# Patient Record
Sex: Female | Born: 1970
Health system: Southern US, Community
[De-identification: ages and names within clinical notes are randomized; demographics above are authoritative.]

## PROBLEM LIST (undated history)

## (undated) DIAGNOSIS — T7840XA Allergy, unspecified, initial encounter: Secondary | ICD-10-CM

## (undated) HISTORY — DX: Allergy, unspecified, initial encounter: T78.40XA

## (undated) HISTORY — PX: AUGMENTATION MAMMAPLASTY: SUR837

---

## 2009-10-30 ENCOUNTER — Emergency Department (HOSPITAL_COMMUNITY): Admission: EM | Admit: 2009-10-30 | Discharge: 2009-10-30 | Payer: Self-pay | Admitting: Emergency Medicine

## 2010-04-29 ENCOUNTER — Other Ambulatory Visit: Payer: Self-pay | Admitting: Obstetrics and Gynecology

## 2010-04-29 DIAGNOSIS — Z1239 Encounter for other screening for malignant neoplasm of breast: Secondary | ICD-10-CM

## 2010-04-29 DIAGNOSIS — Z1231 Encounter for screening mammogram for malignant neoplasm of breast: Secondary | ICD-10-CM

## 2010-05-10 ENCOUNTER — Ambulatory Visit
Admission: RE | Admit: 2010-05-10 | Discharge: 2010-05-10 | Disposition: A | Discharge status: Home / Self Care | Payer: Commercial Managed Care - PPO | Source: Ambulatory Visit | Attending: Obstetrics and Gynecology | Admitting: Obstetrics and Gynecology

## 2010-05-10 DIAGNOSIS — Z1231 Encounter for screening mammogram for malignant neoplasm of breast: Secondary | ICD-10-CM

## 2014-06-27 ENCOUNTER — Ambulatory Visit (INDEPENDENT_AMBULATORY_CARE_PROVIDER_SITE_OTHER): Payer: PRIVATE HEALTH INSURANCE | Admitting: Physician Assistant

## 2014-06-27 VITALS — BP 126/80 | HR 79 | Temp 98.0°F | Resp 16 | Ht 65.5 in | Wt 116.0 lb

## 2014-06-27 DIAGNOSIS — I872 Venous insufficiency (chronic) (peripheral): Secondary | ICD-10-CM | POA: Diagnosis not present

## 2014-06-27 NOTE — Progress Notes (Signed)
[facility] [address], [place] [address] [phone]  Date:  06/27/2014   Name:  Brandi Gray   DOB:  November 26, 1970   MRN:  [id number]  PCP:  Roselee Culver, MD    Chief Complaint: Follow-up   History of Present Illness:  Brandi Gray is a 44 y.o. very pleasant female otherwise healthy patient who presents with the following:  Patient is here for referral to vein specialist.  She reports 5 years of noticeable spider veins along legs.  This first appeared along the right leg, and has now become apparent on left leg.  She denies pain.  General swelling along lower extremity is generally after a work day.  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  She has been seeing Dr. Renaldo Reel at [facility] to address veiny appearance.  Patient reports findings of saphenous vein reflux.  She denies any loss of sensation at legs.  She denies easy bruising, chest pains, palpitations, or dizziness.   Now that the findings suggest medical attention, patient was told by Dr. Renaldo Reel office, that she must get a referral to have "medical" visit, unlike prior visits which were cosmetic.     There are no active problems to display for this patient.   Past Medical History  Diagnosis Date  . Allergy     History reviewed. No pertinent past surgical history.  History  Substance Use Topics  . Smoking status: Never Smoker   . Smokeless tobacco: Never Used  . Alcohol Use: 0.0 oz/week    0 Standard drinks or equivalent per week     Comment: social use    Family History  Problem Relation Age of Onset  . Hypertension Mother   . Cancer Father     Allergies  Allergen Reactions  . Sulfa Antibiotics     Headache     Medication list has been reviewed and updated.  No current outpatient prescriptions on file prior to visit.   No current facility-administered medications on file prior to visit.    Review of Systems: ROS otherwise unremarkable unless  listed below.  Physical Examination: Filed Vitals:   06/27/14 1543  BP: 126/80  Pulse: 79  Temp: 98 F (36.7 C)  Resp: 16   Filed Vitals:   06/27/14 1543  Height: 5' 5.5" (1.664 m)  Weight: 116 lb (52.617 kg)   Body mass index is 19 kg/(m^2). Ideal Body Weight: Weight in (lb) to have BMI = 25: 152.2  Physical Exam  Constitutional: She is oriented to person, place, and time. She appears well-developed and well-nourished. No distress.  HENT:  Head: Normocephalic and atraumatic.  Eyes: Pupils are equal, round, and reactive to light.  Neck: Normal range of motion. No thyromegaly present.  Cardiovascular: Normal rate, regular rhythm, normal heart sounds and intact distal pulses.  Exam reveals no gallop, no distant heart sounds and no friction rub.   No murmur heard. Pulses:      Popliteal pulses are 1+ on the right side, and 2+ on the left side.       Dorsalis pedis pulses are 2+ on the right side, and 2+ on the left side.       Posterior tibial pulses are 2+ on the right side, and 2+ on the left side.  Pulmonary/Chest: Effort normal and breath sounds normal. No respiratory distress. She has no wheezes.  Musculoskeletal: Normal range of motion. She exhibits no edema or tenderness.  Lymphadenopathy:  She has no cervical adenopathy.  Neurological: She is alert and oriented to person, place, and time.  Skin: No rash noted. She is not diaphoretic.  No erythema or swelling at extremity.  Spider veins scantly placed bilaterally along the calf.  No prominent varicose apparent.    Psychiatric: She has a normal mood and affect. Her behavior is normal.     Assessment and Plan: 44 year old female is here today for desire for referral for vein specialist.  Venous insufficiency - Plan: Ambulatory referral to Vascular Surgery Ivar Drape, PA-C [facility] 3/29/201610:36 PM

## 2015-07-23 DIAGNOSIS — Z0000 Encounter for general adult medical examination without abnormal findings: Secondary | ICD-10-CM | POA: Diagnosis not present

## 2015-07-23 DIAGNOSIS — Z13220 Encounter for screening for lipoid disorders: Secondary | ICD-10-CM | POA: Diagnosis not present

## 2015-09-25 ENCOUNTER — Other Ambulatory Visit (HOSPITAL_COMMUNITY)
Admission: RE | Admit: 2015-09-25 | Discharge: 2015-09-25 | Disposition: A | Discharge status: Home / Self Care | Payer: 59 | Source: Ambulatory Visit | Attending: Family Medicine | Admitting: Family Medicine

## 2015-09-25 ENCOUNTER — Other Ambulatory Visit: Payer: Self-pay | Admitting: Family Medicine

## 2015-09-25 DIAGNOSIS — Z01419 Encounter for gynecological examination (general) (routine) without abnormal findings: Secondary | ICD-10-CM | POA: Diagnosis not present

## 2015-09-26 LAB — CYTOLOGY - PAP

## 2016-05-05 DIAGNOSIS — H524 Presbyopia: Secondary | ICD-10-CM | POA: Diagnosis not present

## 2016-08-05 DIAGNOSIS — I83813 Varicose veins of bilateral lower extremities with pain: Secondary | ICD-10-CM | POA: Diagnosis not present

## 2016-08-08 DIAGNOSIS — I8311 Varicose veins of right lower extremity with inflammation: Secondary | ICD-10-CM | POA: Diagnosis not present

## 2016-08-08 DIAGNOSIS — I8312 Varicose veins of left lower extremity with inflammation: Secondary | ICD-10-CM | POA: Diagnosis not present

## 2016-08-26 DIAGNOSIS — I83813 Varicose veins of bilateral lower extremities with pain: Secondary | ICD-10-CM | POA: Diagnosis not present

## 2017-04-20 DIAGNOSIS — H524 Presbyopia: Secondary | ICD-10-CM | POA: Diagnosis not present

## 2017-07-16 DIAGNOSIS — Z0000 Encounter for general adult medical examination without abnormal findings: Secondary | ICD-10-CM | POA: Diagnosis not present

## 2017-07-16 DIAGNOSIS — Z13220 Encounter for screening for lipoid disorders: Secondary | ICD-10-CM | POA: Diagnosis not present

## 2017-08-18 DIAGNOSIS — I83813 Varicose veins of bilateral lower extremities with pain: Secondary | ICD-10-CM | POA: Diagnosis not present

## 2018-01-28 ENCOUNTER — Other Ambulatory Visit: Payer: Self-pay | Admitting: Family Medicine

## 2018-01-28 DIAGNOSIS — Z1231 Encounter for screening mammogram for malignant neoplasm of breast: Secondary | ICD-10-CM

## 2018-03-09 ENCOUNTER — Ambulatory Visit
Admission: RE | Admit: 2018-03-09 | Discharge: 2018-03-09 | Disposition: A | Discharge status: Home / Self Care | Payer: 59 | Source: Ambulatory Visit | Attending: Family Medicine | Admitting: Family Medicine

## 2018-03-09 DIAGNOSIS — Z1231 Encounter for screening mammogram for malignant neoplasm of breast: Secondary | ICD-10-CM | POA: Diagnosis not present

## 2018-08-31 ENCOUNTER — Other Ambulatory Visit (HOSPITAL_COMMUNITY)
Admission: RE | Admit: 2018-08-31 | Discharge: 2018-08-31 | Disposition: A | Discharge status: Home / Self Care | Payer: 59 | Source: Ambulatory Visit | Attending: Family Medicine | Admitting: Family Medicine

## 2018-08-31 ENCOUNTER — Other Ambulatory Visit: Payer: Self-pay | Admitting: Family Medicine

## 2018-08-31 DIAGNOSIS — Z124 Encounter for screening for malignant neoplasm of cervix: Secondary | ICD-10-CM | POA: Diagnosis not present

## 2018-08-31 DIAGNOSIS — Z136 Encounter for screening for cardiovascular disorders: Secondary | ICD-10-CM | POA: Diagnosis not present

## 2018-08-31 DIAGNOSIS — Z0000 Encounter for general adult medical examination without abnormal findings: Secondary | ICD-10-CM | POA: Diagnosis not present

## 2018-09-02 LAB — CYTOLOGY - PAP
Diagnosis: NEGATIVE
HPV: NOT DETECTED

## 2019-03-02 MED FILL — CEPHALEXIN 500 MG CAPSULE: 500 | 1 days supply | Qty: 4 | Fill #0

## 2019-09-01 DIAGNOSIS — L709 Acne, unspecified: Secondary | ICD-10-CM | POA: Diagnosis not present

## 2019-09-01 DIAGNOSIS — Z1211 Encounter for screening for malignant neoplasm of colon: Secondary | ICD-10-CM | POA: Diagnosis not present

## 2019-09-01 DIAGNOSIS — Z13220 Encounter for screening for lipoid disorders: Secondary | ICD-10-CM | POA: Diagnosis not present

## 2019-09-01 DIAGNOSIS — Z0000 Encounter for general adult medical examination without abnormal findings: Secondary | ICD-10-CM | POA: Diagnosis not present

## 2019-09-06 ENCOUNTER — Encounter: Payer: Self-pay | Admitting: Gastroenterology

## 2019-09-12 DIAGNOSIS — H524 Presbyopia: Secondary | ICD-10-CM | POA: Diagnosis not present

## 2019-11-02 ENCOUNTER — Ambulatory Visit (AMBULATORY_SURGERY_CENTER): Payer: Self-pay

## 2019-11-02 ENCOUNTER — Other Ambulatory Visit: Payer: Self-pay

## 2019-11-02 ENCOUNTER — Encounter: Payer: Self-pay | Admitting: Gastroenterology

## 2019-11-02 VITALS — Ht 65.5 in | Wt 118.6 lb

## 2019-11-02 DIAGNOSIS — Z01818 Encounter for other preprocedural examination: Secondary | ICD-10-CM

## 2019-11-02 DIAGNOSIS — Z1211 Encounter for screening for malignant neoplasm of colon: Secondary | ICD-10-CM

## 2019-11-02 MED ORDER — NA SULFATE-K SULFATE-MG SULF 17.5-3.13-1.6 GM/177ML PO SOLN: 1.0000 | Freq: Once | ORAL | 0 refills | Status: AC

## 2019-11-02 MED FILL — SUPREP BOWEL PREP KIT: 17.5-3.13-1 | 1 days supply | Qty: 354 | Fill #0

## 2019-11-02 NOTE — Progress Notes (Signed)
No egg or soy allergy known to patient  No issues with past sedation with any surgeries or procedures No intubation problems in the past  No diet pills per patient No home 02 use per patient  No blood thinners per patient  Pt denies issues with constipation  No A fib or A flutter  EMMI video to pt   COVID 19 guidelines implemented in [facility] today  COVID vaccines completed on 04/2019 per pt;  Due to the COVID-19 pandemic we are asking patients to follow these guidelines. Please only bring one care partner. Please be aware that your care partner may wait in the car in the parking lot or if they feel like they will be too hot to wait in the car, they may wait in the lobby on the 4th floor. All care partners are required to wear a mask the entire time (we do not have any that we can provide them), they need to practice social distancing, and we will do a Covid check for all patient's and care partners when you arrive. Also we will check their temperature and your temperature. If the care partner waits in their car they need to stay in the parking lot the entire time and we will call them on their cell phone when the patient is ready for discharge so they can bring the car to the front of the building. Also all patient's will need to wear a mask into building.

## 2019-11-16 ENCOUNTER — Other Ambulatory Visit: Payer: Self-pay

## 2019-11-16 ENCOUNTER — Encounter: Payer: Self-pay | Admitting: Gastroenterology

## 2019-11-16 ENCOUNTER — Ambulatory Visit (AMBULATORY_SURGERY_CENTER): Payer: 59 | Admitting: Gastroenterology

## 2019-11-16 VITALS — BP 106/62 | HR 62 | Temp 97.8°F | Resp 17 | Ht 65.5 in | Wt 118.6 lb

## 2019-11-16 DIAGNOSIS — D128 Benign neoplasm of rectum: Secondary | ICD-10-CM

## 2019-11-16 DIAGNOSIS — Z1211 Encounter for screening for malignant neoplasm of colon: Secondary | ICD-10-CM | POA: Diagnosis not present

## 2019-11-16 DIAGNOSIS — D129 Benign neoplasm of anus and anal canal: Secondary | ICD-10-CM

## 2019-11-16 DIAGNOSIS — K635 Polyp of colon: Secondary | ICD-10-CM

## 2019-11-16 DIAGNOSIS — K621 Rectal polyp: Secondary | ICD-10-CM | POA: Diagnosis not present

## 2019-11-16 MED ORDER — SODIUM CHLORIDE 0.9 % IV SOLN: 500.0000 mL | Freq: Once | INTRAVENOUS | Status: DC

## 2019-11-16 NOTE — Patient Instructions (Signed)
Discharge instructions given. Handouts on polyps and hemorrhoids. Resume previous medications. YOU HAD AN ENDOSCOPIC PROCEDURE TODAY AT THE [facility]:   Refer to the procedure report that was given to you for any specific questions about what was found during the examination.  If the procedure report does not answer your questions, please call your gastroenterologist to clarify.  If you requested that your care partner not be given the details of your procedure findings, then the procedure report has been included in a sealed envelope for you to review at your convenience later.  YOU SHOULD EXPECT: Some feelings of bloating in the abdomen. Passage of more gas than usual.  Walking can help get rid of the air that was put into your GI tract during the procedure and reduce the bloating. If you had a lower endoscopy (such as a colonoscopy or flexible sigmoidoscopy) you may notice spotting of blood in your stool or on the toilet paper. If you underwent a bowel prep for your procedure, you may not have a normal bowel movement for a few days.  Please Note:  You might notice some irritation and congestion in your nose or some drainage.  This is from the oxygen used during your procedure.  There is no need for concern and it should clear up in a day or so.  SYMPTOMS TO REPORT IMMEDIATELY:  Following lower endoscopy (colonoscopy or flexible sigmoidoscopy):  Excessive amounts of blood in the stool  Significant tenderness or worsening of abdominal pains  Swelling of the abdomen that is new, acute  Fever of 100F or higher   For urgent or emergent issues, a gastroenterologist can be reached at any hour by calling [phone]. Do not use MyChart messaging for urgent concerns.    DIET:  We do recommend a small meal at first, but then you may proceed to your regular diet.  Drink plenty of fluids but you should avoid alcoholic beverages for 24 hours.  ACTIVITY:  You should plan to take it  easy for the rest of today and you should NOT DRIVE or use heavy machinery until tomorrow (because of the sedation medicines used during the test).    FOLLOW UP: Our staff will call the number listed on your records 48-72 hours following your procedure to check on you and address any questions or concerns that you may have regarding the information given to you following your procedure. If we do not reach you, we will leave a message.  We will attempt to reach you two times.  During this call, we will ask if you have developed any symptoms of COVID 19. If you develop any symptoms (ie: fever, flu-like symptoms, shortness of breath, cough etc.) before then, please call [phone].  If you test positive for Covid 19 in the 2 weeks post procedure, please call and report this information to us.    If any biopsies were taken you will be contacted by phone or by letter within the next 1-3 weeks.  Please call us at [phone] if you have not heard about the biopsies in 3 weeks.    SIGNATURES/CONFIDENTIALITY: You and/or your care partner have signed paperwork which will be entered into your electronic medical record.  These signatures attest to the fact that that the information above on your After Visit Summary has been reviewed and is understood.  Full responsibility of the confidentiality of this discharge information lies with you and/or your care-partner.  

## 2019-11-16 NOTE — Progress Notes (Signed)
Called to room to assist during endoscopic procedure.  Patient ID and intended procedure confirmed with present staff. Received instructions for my participation in the procedure from the performing physician.  

## 2019-11-16 NOTE — Op Note (Signed)
[facility] Patient Name: Brandi Gray Procedure Date: 11/16/2019 9:49 AM MRN: [id number] Endoscopist: Milus Banister , MD Age: 49 Referring MD:  Date of Birth: 26-Mar-1971 Gender: Female Account #: [id number] Procedure:                Colonoscopy Indications:              Screening for colorectal malignant neoplasm Medicines:                Monitored Anesthesia Care Procedure:                Pre-Anesthesia Assessment:                           - Prior to the procedure, a History and Physical                            was performed, and patient medications and                            allergies were reviewed. The patient's tolerance of                            previous anesthesia was also reviewed. The risks                            and benefits of the procedure and the sedation                            options and risks were discussed with the patient.                            All questions were answered, and informed consent                            was obtained. Prior Anticoagulants: The patient has                            taken no previous anticoagulant or antiplatelet                            agents. ASA Grade Assessment: I - A normal, healthy                            patient. After reviewing the risks and benefits,                            the patient was deemed in satisfactory condition to                            undergo the procedure.                           After obtaining informed consent, the colonoscope  was passed under direct vision. Throughout the                            procedure, the patient's blood pressure, pulse, and                            oxygen saturations were monitored continuously. The                            Colonoscope was introduced through the anus and                            advanced to the the cecum, identified by                            appendiceal orifice and ileocecal  valve. The                            colonoscopy was performed without difficulty. The                            patient tolerated the procedure well. The quality                            of the bowel preparation was good. The ileocecal                            valve, appendiceal orifice, and rectum were                            photographed. Scope In: 9:56:04 AM Scope Out: 10:08:31 AM Scope Withdrawal Time: 0 hours 8 minutes 19 seconds  Total Procedure Duration: 0 hours 12 minutes 27 seconds  Findings:                 A 2 mm polyp was found in the recto-sigmoid colon.                            The polyp was sessile. The polyp was removed with a                            cold snare. Resection and retrieval were complete.                           Internal hemorrhoids were found. The hemorrhoids                            were small.                           The exam was otherwise without abnormality on                            direct and retroflexion views. Complications:  No immediate complications. Estimated blood loss:                            None. Estimated Blood Loss:     Estimated blood loss: none. Impression:               - One 2 mm polyp at the recto-sigmoid colon,                            removed with a cold snare. Resected and retrieved.                           - Small internal hemorrhoids.                           - The examination was otherwise normal on direct                            and retroflexion views. Recommendation:           - Patient has a contact number available for                            emergencies. The signs and symptoms of potential                            delayed complications were discussed with the                            patient. Return to normal activities tomorrow.                            Written discharge instructions were provided to the                            patient.                           -  Resume previous diet.                           - Continue present medications.                           - Await pathology results. Milus Banister, MD 11/16/2019 10:12:32 AM This report has been signed electronically.

## 2019-11-16 NOTE — Progress Notes (Signed)
To PACU, VSS. Report to [email or web address] 

## 2019-11-16 NOTE — Progress Notes (Signed)
VS-CW  Pt's states no medical or surgical changes since previsit or office visit.  

## 2019-11-18 ENCOUNTER — Telehealth: Payer: Self-pay

## 2019-11-18 ENCOUNTER — Telehealth: Payer: Self-pay | Admitting: *Deleted

## 2019-11-18 NOTE — Telephone Encounter (Signed)
2nd follow up call made.  NALM 

## 2019-11-18 NOTE — Telephone Encounter (Signed)
FIRST FOLLOW UP CALL MADE, LEFT MESSAGE.

## 2019-11-21 ENCOUNTER — Encounter: Payer: Self-pay | Admitting: Gastroenterology

## 2020-07-27 ENCOUNTER — Other Ambulatory Visit: Payer: Self-pay | Admitting: Family Medicine

## 2020-07-27 DIAGNOSIS — Z1231 Encounter for screening mammogram for malignant neoplasm of breast: Secondary | ICD-10-CM

## 2020-09-18 ENCOUNTER — Ambulatory Visit
Admission: RE | Admit: 2020-09-18 | Discharge: 2020-09-18 | Disposition: A | Discharge status: Home / Self Care | Payer: 59 | Source: Ambulatory Visit | Attending: Family Medicine | Admitting: Family Medicine

## 2020-09-18 ENCOUNTER — Ambulatory Visit: Payer: 59

## 2020-09-18 ENCOUNTER — Other Ambulatory Visit: Payer: Self-pay

## 2020-09-18 DIAGNOSIS — Z1231 Encounter for screening mammogram for malignant neoplasm of breast: Secondary | ICD-10-CM

## 2022-01-30 IMAGING — MG DIGITAL SCREENING BREAST BILAT IMPLANT W/ TOMO W/ CAD
9 of 12 series · 9 of 28 positions shown · non-contrast
Comparison: Previous exam(s).

CLINICAL DATA: Screening.

EXAM:
DIGITAL SCREENING BILATERAL MAMMOGRAM WITH IMPLANTS, CAD AND
TOMOSYNTHESIS
TECHNIQUE: Bilateral screening digital craniocaudal and mediolateral oblique
mammograms were obtained. Bilateral screening digital breast
tomosynthesis was performed. The images were evaluated with
computer-aided detection. Standard and/or implant displaced views
were performed.

[R MLO]
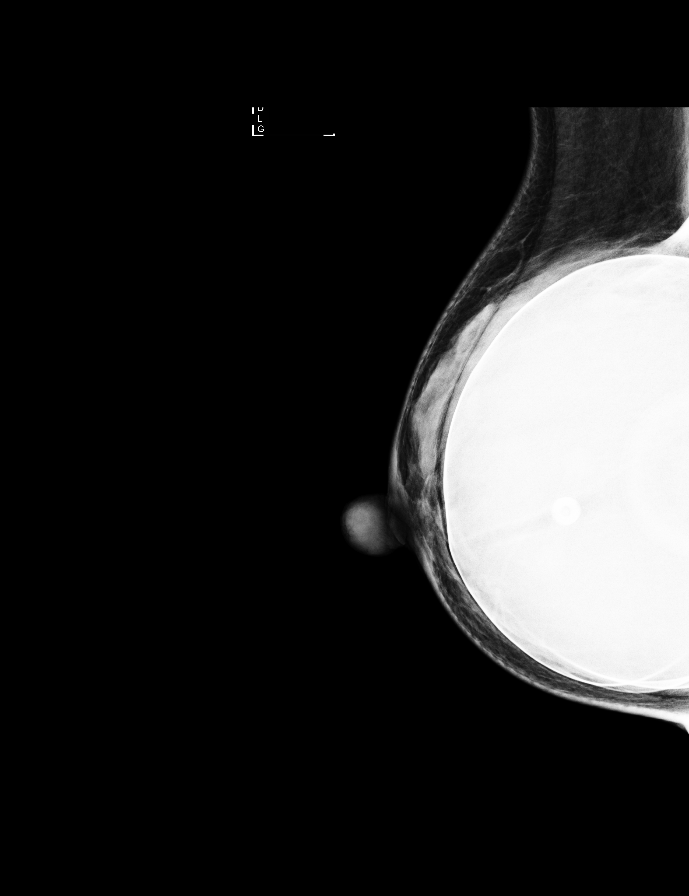

[L MLO]
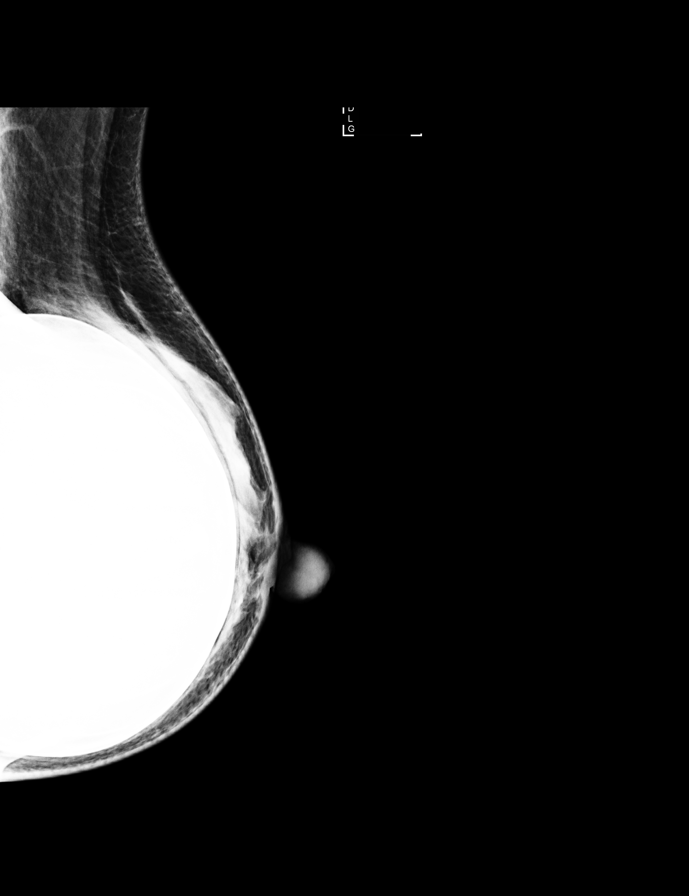

[R CC]
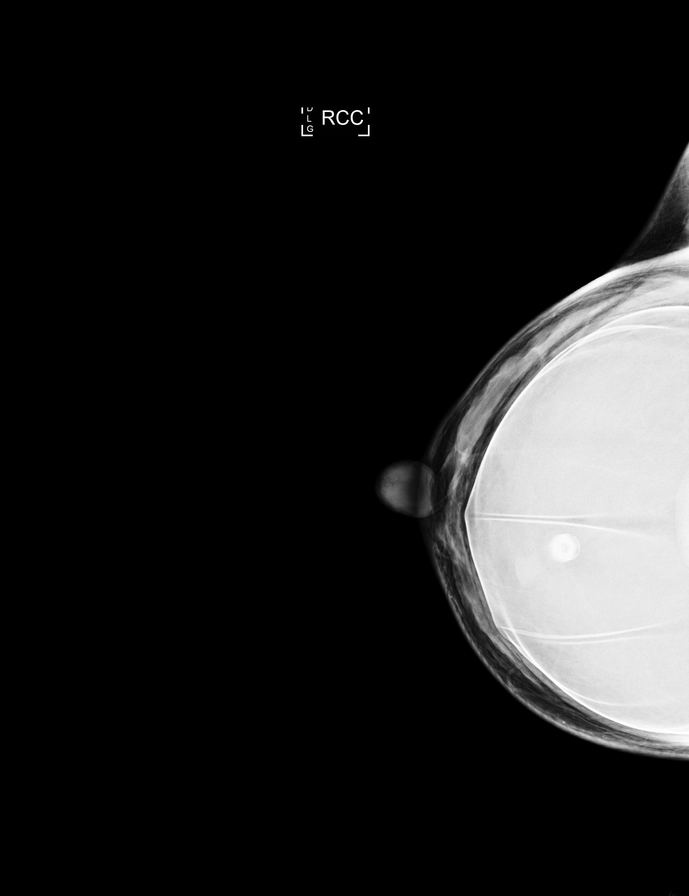

[L CC]
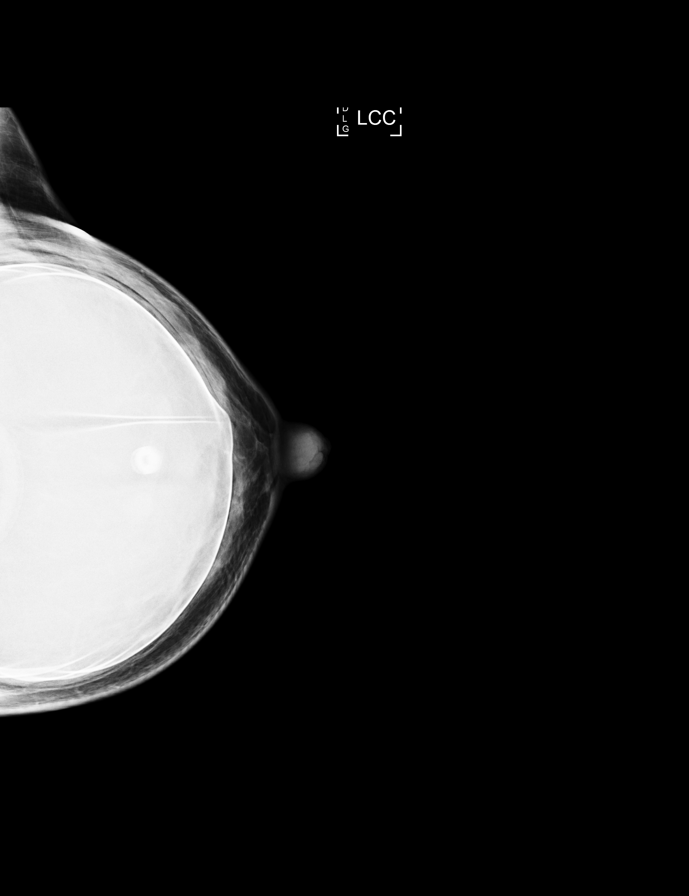

[L MLO synth-2D]
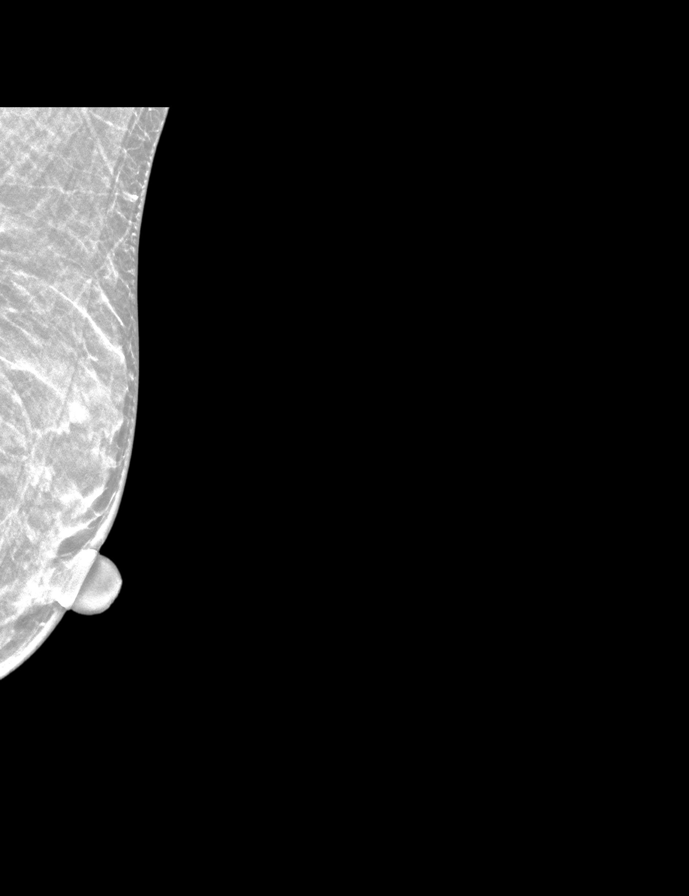

[R MLO synth-2D]
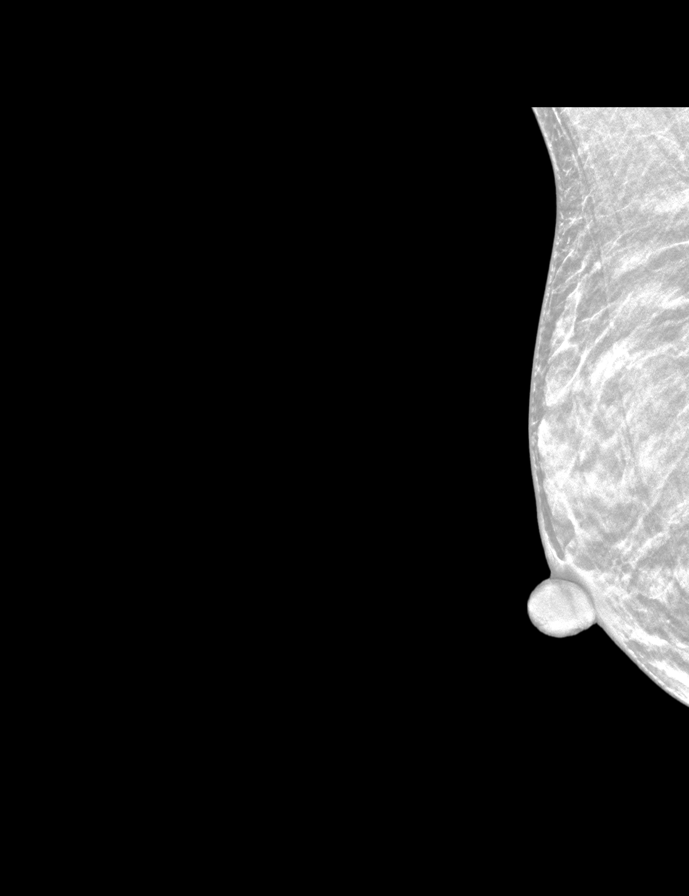

[R CC synth-2D]
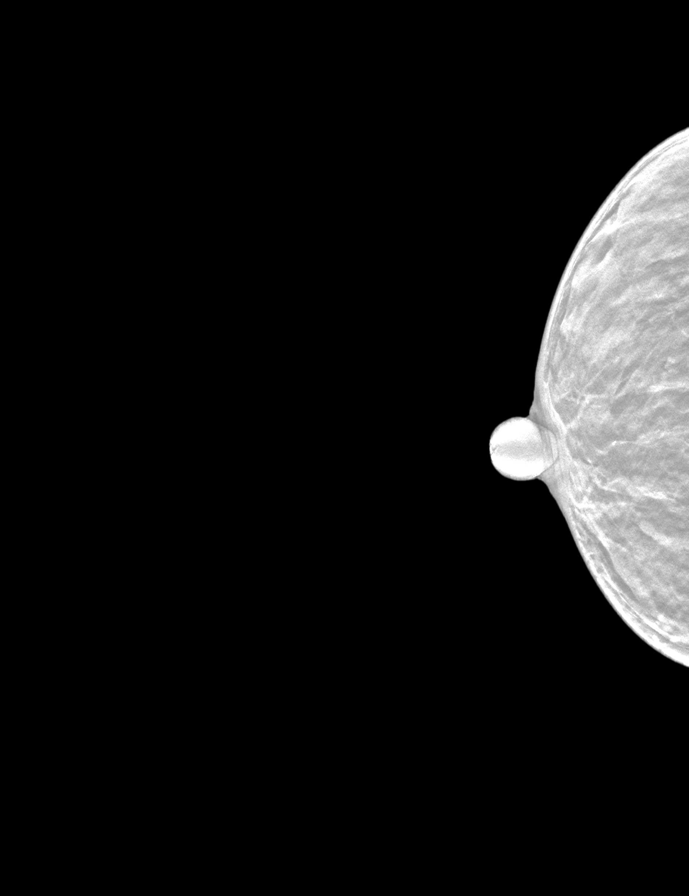

[L CC synth-2D]
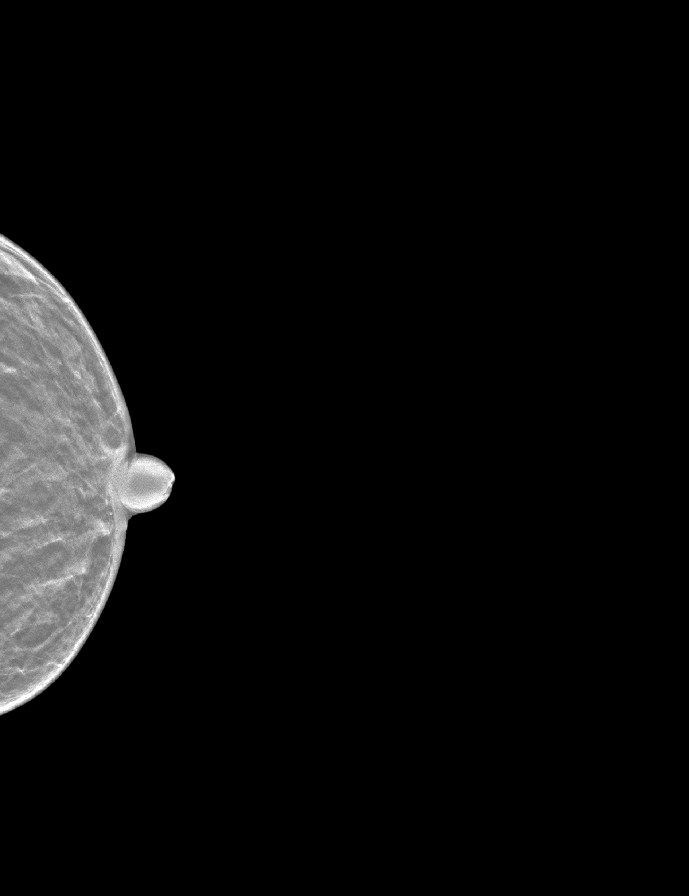

[R CCID BREAST TOMOSYNTHESIS IMAGE tomo · tomo slice 11/21.0]
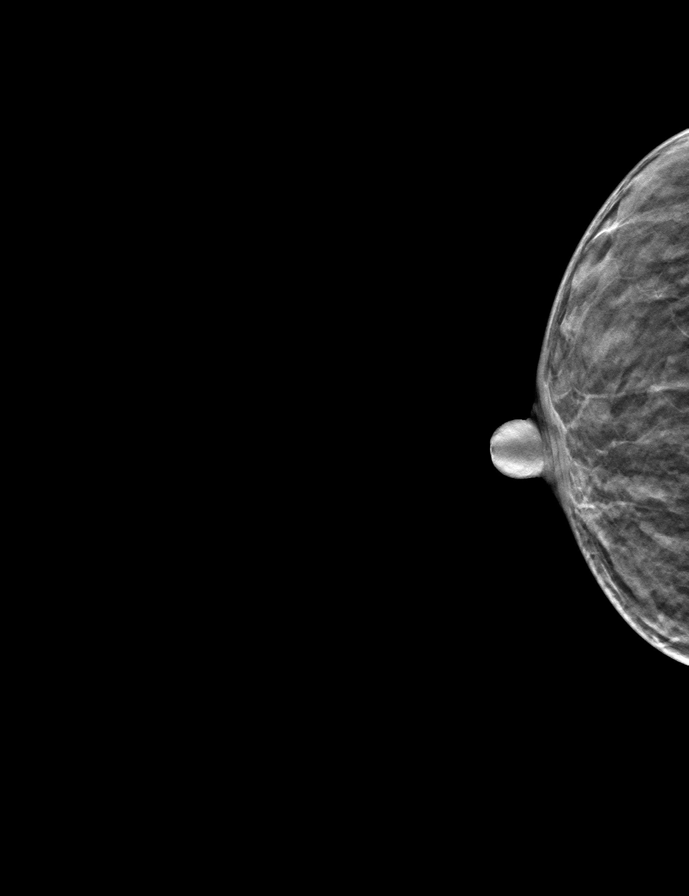

[9 of 28 positions shown; findings below may reference images not displayed]

ACR Breast Density Category c: The breast tissue is heterogeneously
dense, which may obscure small masses.
FINDINGS: The patient has retropectoral implants. There are no findings
suspicious for malignancy.
IMPRESSION: No mammographic evidence of malignancy. A result letter of this
screening mammogram will be mailed directly to the patient.

RECOMMENDATION:
Screening mammogram in one year. (Code:LT-E-7TH)

BI-RADS CATEGORY  1:  Negative.

## 2022-08-06 ENCOUNTER — Other Ambulatory Visit: Payer: Self-pay | Admitting: Family Medicine

## 2022-08-06 DIAGNOSIS — Z1231 Encounter for screening mammogram for malignant neoplasm of breast: Secondary | ICD-10-CM

## 2022-09-12 ENCOUNTER — Ambulatory Visit
Admission: RE | Admit: 2022-09-12 | Discharge: 2022-09-12 | Disposition: A | Discharge status: Home / Self Care | Payer: 59 | Source: Ambulatory Visit | Attending: Family Medicine | Admitting: Family Medicine

## 2022-09-12 DIAGNOSIS — Z1231 Encounter for screening mammogram for malignant neoplasm of breast: Secondary | ICD-10-CM | POA: Diagnosis not present

## 2022-11-11 DIAGNOSIS — L988 Other specified disorders of the skin and subcutaneous tissue: Secondary | ICD-10-CM | POA: Diagnosis not present

## 2022-11-11 DIAGNOSIS — Z13220 Encounter for screening for lipoid disorders: Secondary | ICD-10-CM | POA: Diagnosis not present

## 2022-11-11 DIAGNOSIS — Z0000 Encounter for general adult medical examination without abnormal findings: Secondary | ICD-10-CM | POA: Diagnosis not present

## 2023-09-28 ENCOUNTER — Other Ambulatory Visit: Payer: Self-pay | Admitting: Family Medicine

## 2023-09-28 DIAGNOSIS — Z0000 Encounter for general adult medical examination without abnormal findings: Secondary | ICD-10-CM

## 2023-10-09 ENCOUNTER — Ambulatory Visit
Admission: RE | Admit: 2023-10-09 | Discharge: 2023-10-09 | Disposition: A | Discharge status: Home / Self Care | Source: Ambulatory Visit | Attending: Family Medicine | Admitting: Family Medicine

## 2023-10-09 DIAGNOSIS — Z1231 Encounter for screening mammogram for malignant neoplasm of breast: Secondary | ICD-10-CM | POA: Diagnosis not present

## 2023-10-09 DIAGNOSIS — Z0000 Encounter for general adult medical examination without abnormal findings: Secondary | ICD-10-CM

## 2023-12-02 DIAGNOSIS — L988 Other specified disorders of the skin and subcutaneous tissue: Secondary | ICD-10-CM | POA: Diagnosis not present

## 2023-12-02 DIAGNOSIS — Z124 Encounter for screening for malignant neoplasm of cervix: Secondary | ICD-10-CM | POA: Diagnosis not present

## 2023-12-02 DIAGNOSIS — N951 Menopausal and female climacteric states: Secondary | ICD-10-CM | POA: Diagnosis not present

## 2023-12-02 DIAGNOSIS — Z13220 Encounter for screening for lipoid disorders: Secondary | ICD-10-CM | POA: Diagnosis not present

## 2023-12-02 DIAGNOSIS — N889 Noninflammatory disorder of cervix uteri, unspecified: Secondary | ICD-10-CM | POA: Diagnosis not present

## 2023-12-02 DIAGNOSIS — Z0000 Encounter for general adult medical examination without abnormal findings: Secondary | ICD-10-CM | POA: Diagnosis not present

## 2023-12-17 DIAGNOSIS — N841 Polyp of cervix uteri: Secondary | ICD-10-CM | POA: Diagnosis not present

## 2023-12-17 DIAGNOSIS — N889 Noninflammatory disorder of cervix uteri, unspecified: Secondary | ICD-10-CM | POA: Diagnosis not present

## 2023-12-17 DIAGNOSIS — N840 Polyp of corpus uteri: Secondary | ICD-10-CM | POA: Diagnosis not present

## 2024-01-28 DIAGNOSIS — N841 Polyp of cervix uteri: Secondary | ICD-10-CM | POA: Diagnosis not present
